# Patient Record
Sex: Male | Born: 1985 | Hispanic: Refuse to answer | Marital: Married
Health system: Midwestern US, Academic
[De-identification: ages and names within clinical notes are randomized; demographics above are authoritative.]

---

## 2017-07-06 ENCOUNTER — Encounter: Admit: 2017-07-06 | Discharge: 2017-07-06

## 2017-07-06 ENCOUNTER — Ambulatory Visit: Admit: 2017-07-06 | Discharge: 2017-07-06 | Payer: Worker's Compensation

## 2017-07-06 DIAGNOSIS — R52 Pain, unspecified: Principal | ICD-10-CM

## 2017-07-11 ENCOUNTER — Ambulatory Visit: Admit: 2017-07-11 | Discharge: 2017-07-11 | Payer: Worker's Compensation

## 2017-07-13 ENCOUNTER — Ambulatory Visit: Admit: 2017-07-13 | Discharge: 2017-07-13 | Payer: Worker's Compensation

## 2017-07-18 ENCOUNTER — Ambulatory Visit: Admit: 2017-07-18 | Discharge: 2017-07-18 | Payer: Worker's Compensation

## 2017-07-19 ENCOUNTER — Ambulatory Visit: Admit: 2017-07-19 | Discharge: 2017-07-19 | Payer: Worker's Compensation

## 2017-07-26 ENCOUNTER — Encounter: Admit: 2017-07-26 | Discharge: 2017-07-26

## 2017-07-26 ENCOUNTER — Ambulatory Visit: Admit: 2017-07-26 | Discharge: 2017-07-26 | Payer: Worker's Compensation

## 2019-02-18 IMAGING — MR Shoulder^ARTHROGRAM
7 of 8 series · 26 of 40 positions shown · IV contrast (with contrast)
Comparison: none

[Series 3: T1 fat-sat · axial · 4.0mm · 0.70mm/px · z∈[-81,+29]mm · 6 of 25 slices shown (1 of 3)]
[im 1/25]
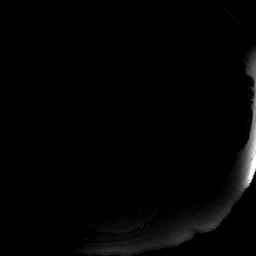
[im 5/25]
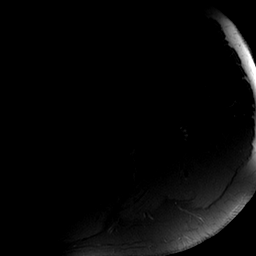
[im 10/25]
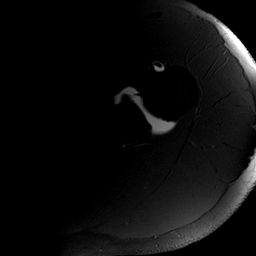
[im 15/25]
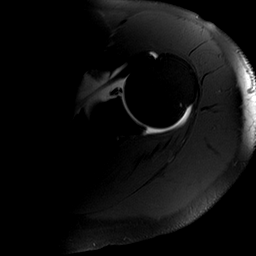
[im 20/25]
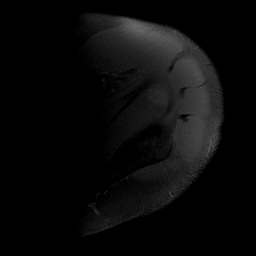
[im 25/25]
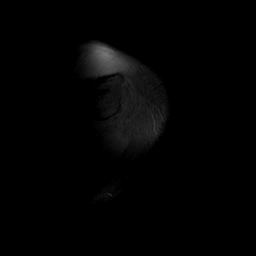

[Series 6: T1 fat-sat · oblique · 4.0mm · 0.27mm/px · 4 of 22 slices shown (2 of 3)]
[im 1/22]
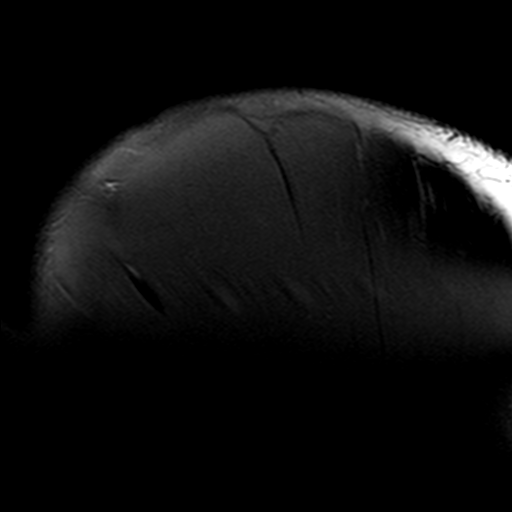
[im 8/22]
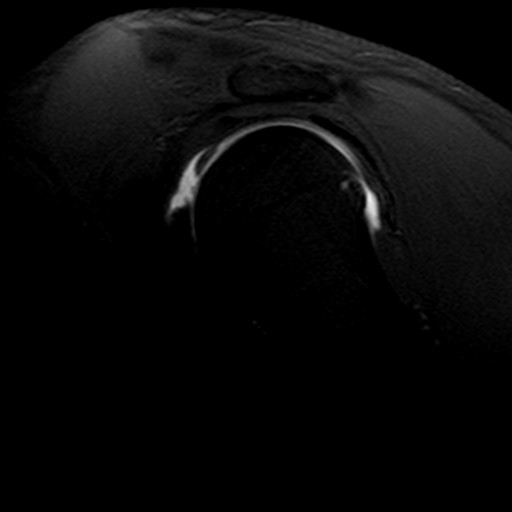
[im 15/22]
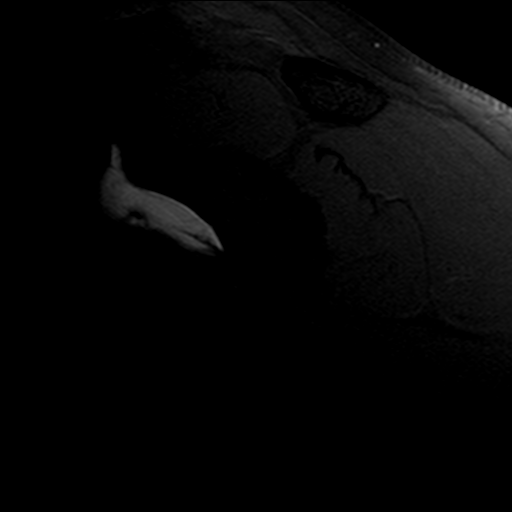
[im 22/22]
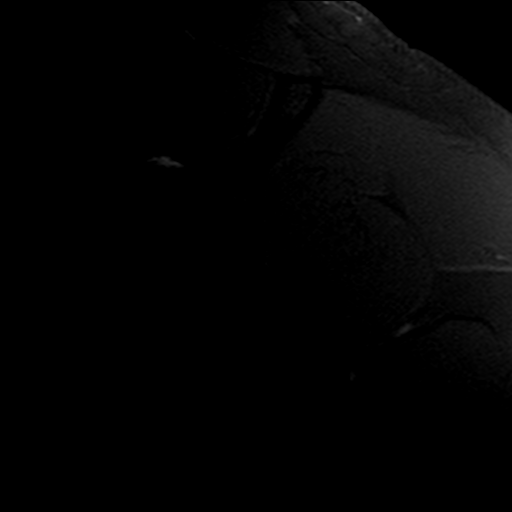

[Series 7: T1 fat-sat · oblique · 4.0mm · 0.27mm/px · 3 of 18 slices shown (3 of 3)]
[im 1/18]
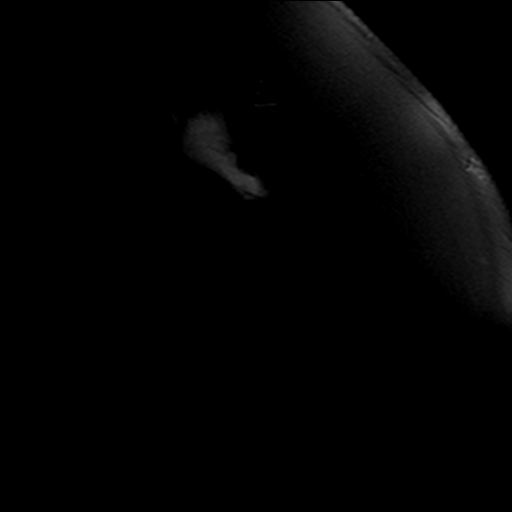
[im 9/18]
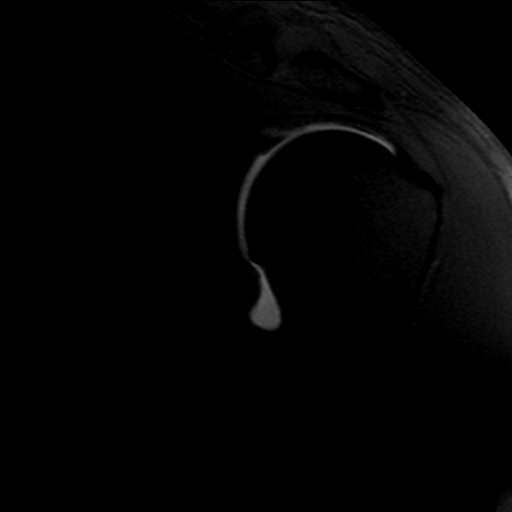
[im 18/18]
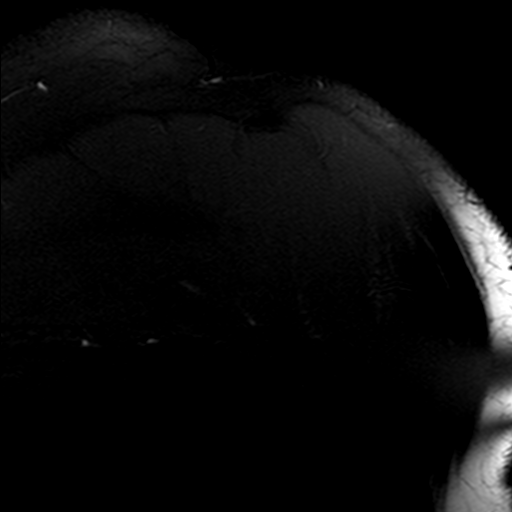

[Series 8: PD · oblique · 4.0mm · 0.55mm/px · 3 of 18 slices shown]
[im 1/18]
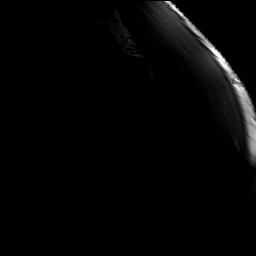
[im 9/18]
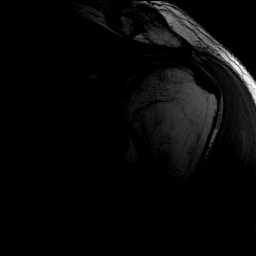
[im 18/18]
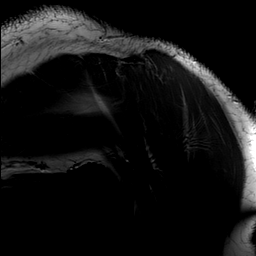

[Series 9: T2 fat-sat · oblique · 4.0mm · 0.31mm/px · 3 of 18 slices shown (1 of 2)]
[im 1/18]
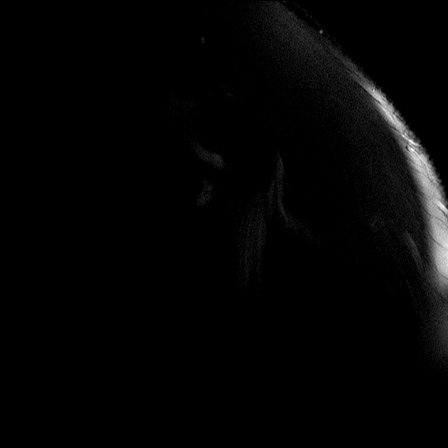
[im 9/18]
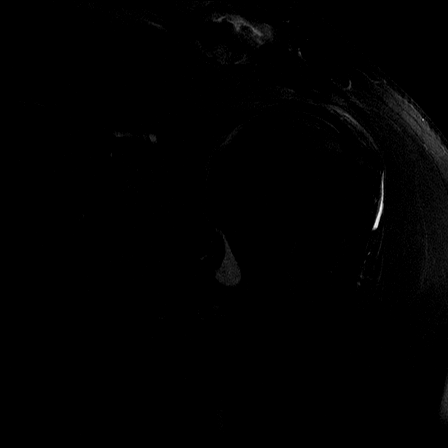
[im 18/18]
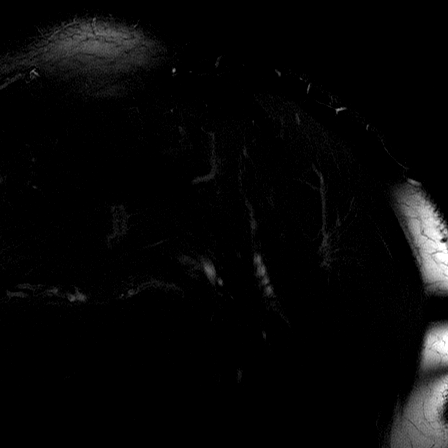

[Series 10: STIR · oblique · 4.0mm · 0.27mm/px · 2 of 18 slices shown]
[im 1/18]
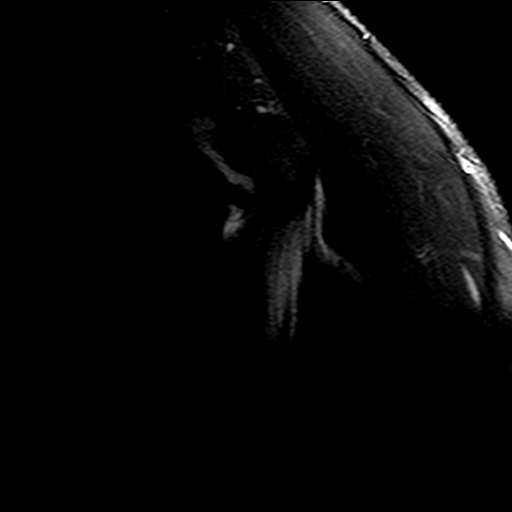
[im 9/18]
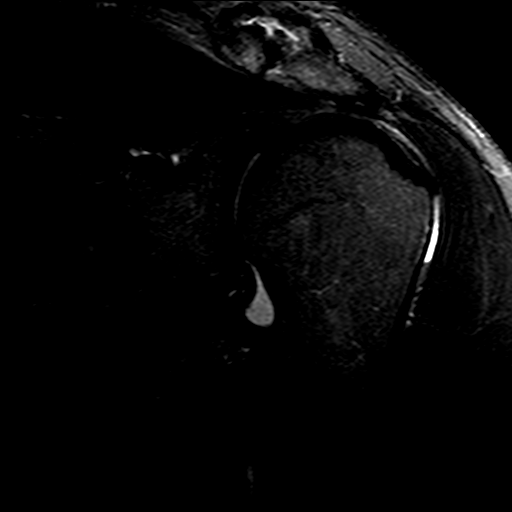

[Series 11: T2 fat-sat · oblique · 4.0mm · 0.31mm/px · 5 of 24 slices shown (2 of 2)]
[im 1/24]
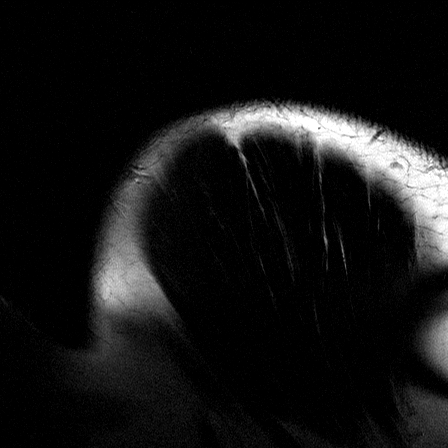
[im 6/24]
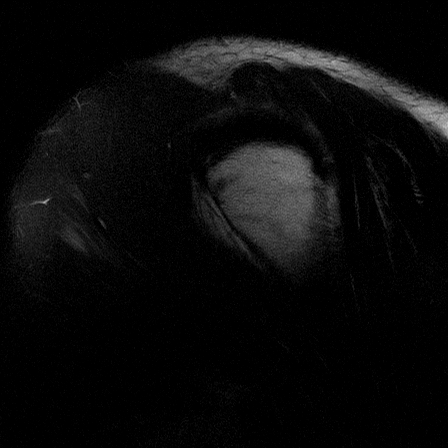
[im 12/24]
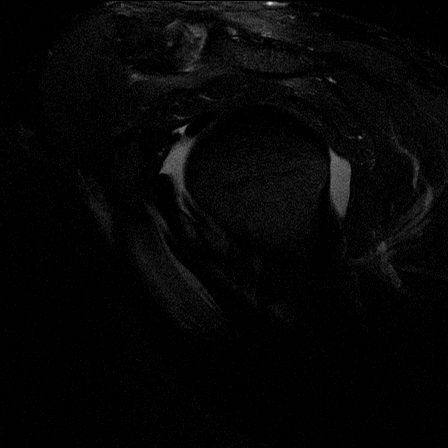
[im 18/24]
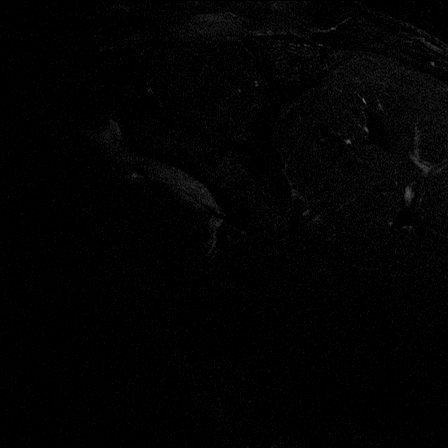
[im 24/24]
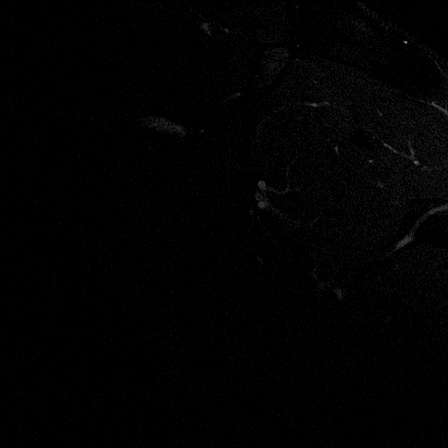

[26 of 40 positions shown; findings below may reference images not displayed]

MRI REPORT

DIAGNOSTIC STUDIES

EXAM

MR left SHOULDER ARTHROGRAM

INDICATION

31-year-old male with left shoulder pain and decreased range of motion.

TECHNIQUE

Multiplanar, multisequence MR images of the left shoulder were obtained following the
intraarticular administration of gadolinium contrast material. Please see same day fluoroscopy
guided arthrogram report for procedural details.

COMPARISONS

Same day fluoroscopic left shoulder arthrogram.

FINDINGS

BICEPS TENDON: The intraarticular biceps tendon is intact and the extraarticular biceps tendon is
well seated within the bicipital groove.

LABRUM: Abnormal contrast in signal of the superior labrum consistent with SLAP tear, most
apparent sagittal image 12 and coronal image 8. Tear appears confined to the posterior/superior
quadrant 11-12 o'clock positions. Additionally, question tiny paralabral cyst formation along the
inferior labrum near the 5 to [DATE] position best seen sagittal image 13 series [DATE] series 11, and
coronal image 9 suspect for additional underlying tearing.

GLENOHUMERAL LIGAMENTS: The superior glenohumeral ligament, middle glenohumeral ligament, and
anterior and posterior bands of the inferior glenohumeral ligament are intact.

Rotator Cuff: There is tiny punctate 1-2 millimeter focus of fluid signal intensity along the
distal supraspinatus insertion anteriorly at the footprint coronal image 7 suggesting a tiny
punctate partial-thickness tear. The remainder of the cuff is well preserved without abnormality.

MUSCLES: Normal bulk and signal.

ACROMIOCLAVICULAR JOINT: Prominent anterior/inferior distal clavicle osteophyte formation and/or
posttraumatic change, which impresses upon the bursal surface of the supraspinatus in the imaging
position. Mild associated marrow edema of the distal clavicle, no fracture line. Flat acromial
undersurface.

Cartilage: No focal defects.

BONE: Acromioclavicular changes previously mention. Remainder of the marrow signal is normal.
Normal alignment.

SOFT TISSUES: There is no soft tissue mass or fluid collection identified. The neurovascular
structures are unremarkable.

IMPRESSION

1. Nondisplaced SLAP tear.

2. Tiny anterior/inferior paralabral cyst formation suggesting subtle underlying inferior labral
tearing.

3. Tiny focal 1-2 millimeter partial-thickness distal supraspinatus insertional tear suspected.

4. Degenerative and/or posttraumatic changes distal clavicle impress upon the bursal surface of
supraspinatus in imaging position. No fracture line. Mild distal clavicle edema. Consider acute on
chronic osteolysis given patient overall increased muscle mass.

## 2019-04-16 ENCOUNTER — Encounter: Admit: 2019-04-16 | Discharge: 2019-04-16 | Payer: Commercial Managed Care - HMO

## 2019-04-16 ENCOUNTER — Ambulatory Visit: Admit: 2019-04-16 | Discharge: 2019-04-16 | Payer: Commercial Managed Care - HMO

## 2019-04-16 DIAGNOSIS — S82291C Other fracture of shaft of right tibia, initial encounter for open fracture type IIIA, IIIB, or IIIC: Secondary | ICD-10-CM
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
## 2019-04-18 ENCOUNTER — Encounter: Admit: 2019-04-18 | Discharge: 2019-04-18 | Payer: Commercial Managed Care - HMO

## 2019-05-10 ENCOUNTER — Encounter: Admit: 2019-05-10 | Discharge: 2019-05-10 | Payer: Commercial Managed Care - HMO

## 2019-05-10 NOTE — Telephone Encounter
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 Order faxed as requested.

## 2019-05-14 ENCOUNTER — Encounter: Admit: 2019-05-14 | Discharge: 2019-05-14 | Payer: Commercial Managed Care - HMO

## 2019-05-29 ENCOUNTER — Encounter: Admit: 2019-05-29 | Discharge: 2019-05-29 | Payer: Commercial Managed Care - HMO

## 2019-05-29 NOTE — Telephone Encounter
LVM for pt advising that his CT scan shows he's healed his fx's and the known OCD lesion. Advised if pt is interested in surgical intervention (nail removal, reconstructive surgery) that we'd request pt return to clinic to discuss this w/ Dr. Cherene Julian as this would be an extensive surgery to undergo. Requested cb w/ questions/concerns or if pt wants to schedule appnt.

## 2022-02-14 ENCOUNTER — Encounter: Admit: 2022-02-14 | Discharge: 2022-02-14 | Payer: Commercial Managed Care - HMO

## 2022-02-14 ENCOUNTER — Ambulatory Visit: Admit: 2022-02-14 | Discharge: 2022-02-14 | Payer: Commercial Managed Care - HMO

## 2022-02-14 DIAGNOSIS — Z0289 Encounter for other administrative examinations: Secondary | ICD-10-CM

## 2023-01-30 ENCOUNTER — Encounter: Admit: 2023-01-30 | Discharge: 2023-01-30 | Payer: Commercial Managed Care - HMO

## 2023-01-30 ENCOUNTER — Ambulatory Visit: Admit: 2023-01-30 | Discharge: 2023-01-30 | Payer: Private Health Insurance - Indemnity

## 2023-01-30 ENCOUNTER — Ambulatory Visit: Admit: 2023-01-30 | Discharge: 2023-01-30

## 2023-01-30 DIAGNOSIS — S8392XA Sprain of unspecified site of left knee, initial encounter: Secondary | ICD-10-CM

## 2023-01-30 NOTE — Progress Notes
Date of Service: 01/30/2023    Employer: [employer] Kyle Murray  Service: Work Comp Medical    Kyle Murray is a 37 y.o. male.  DOB: 1985/09/29  MRN: [id number]     Subjective:             History of Present Illness  Patient was at work 5 days ago and bent over to pick up a cup and twisted his left knee.  He felt a pop and pain in the knee.  He noted swelling the next day with some mild pain but reports that the pain and swelling if increased since.  He denies prior injuries to that knee.  He is now having difficulty walking and bending due to the knee pain.  No other injuries were reported.  Has no other associated symptoms.       Review of Systems  Associated Review of symptoms was negative.      Objective:         Vitals:    01/30/23 1213   BP: (!) 158/102   BP Source: Arm, Left Upper   Pulse: 102   Temp: 36.9 ?C (98.4 ?F)   Resp: 12   SpO2: 98%   TempSrc: Oral   PainSc: Six  Comment: 10/10 when bending of straightening   Weight: 114.1 kg (251 lb 9.6 oz)   Height: 180.3 cm (5' 11)     Body mass index is 35.09 kg/m?Marland Kitchen     Physical Exam  Vitals and nursing note reviewed.   Constitutional:       General: He is not in acute distress.     Appearance: Normal appearance. He is not ill-appearing.   HENT:      Head: Normocephalic.   Pulmonary:      Effort: Pulmonary effort is normal. No respiratory distress.   Musculoskeletal:      Comments: Tender over anterior lateral and posterior aspect of the left knee.  He does have swelling and an effusion.  He is able to extend the knee against resistance.  His collateral ligaments are stable.  He has no swelling or tenderness of his leg or ankle.  His distal neurovascular exam is intact.   Neurological:      General: No focal deficit present.      Mental Status: He is alert and oriented to person, place, and time.      Gait: Gait normal.   Psychiatric:         Mood and Affect: Mood normal.         Behavior: Behavior normal.         Imaging  Test:  Left knee 3 views  Indication: pain after twisting knee  Findings: no acute fracture  X-Rays were performed in this clinic. The findings were reviewed with the patient.  Radiology over read is pending       Assessment and Plan:    Encounter Diagnosis:  (O75.64PP) Sprain of left knee, unspecified ligament, initial encounter       Patient has a sprain on the left knee.  He is having significant pain and limitation with movement.  I have ordered an MRI.  He will need to have restrictions for the time being.  Follow-up in 1 week.       Total Time Today was 45 minutes in the following activities:   - Preparing to see the patient  - Obtaining and/or reviewing separately obtained history (mechanism of injury, work Academic librarian)  - Performing a  medically appropriate examination and/or evaluation (evaluating risk for work disability, functional impact/outcomes)  - Counseling and educating the patient  - Documenting clinical information in the electronic medical record  - Ordering medications, tests and/or procedures  - Discussion of tests and procedures indicated at this appointment

## 2023-02-07 ENCOUNTER — Encounter: Admit: 2023-02-07 | Discharge: 2023-02-07 | Payer: Commercial Managed Care - HMO

## 2023-02-07 ENCOUNTER — Ambulatory Visit: Admit: 2023-02-07 | Discharge: 2023-02-07 | Payer: Private Health Insurance - Indemnity

## 2023-02-07 DIAGNOSIS — S8392XD Sprain of unspecified site of left knee, subsequent encounter: Secondary | ICD-10-CM

## 2023-02-07 NOTE — Progress Notes
Date of Service: 02/07/2023    Employer: [employer]  Service: Work Comp Return    Kyle Murray is a 37 y.o. male.  DOB: 1985/10/25  MRN: [id number]     Subjective:             History of Present Illness  The patient returns today for follow-up.  Complains of continued pain and instability in the left knee.  He has had no improvement.  He has not yet heard about his MRI.  He has no new complaints.       Review of Systems  Associated Review of symptoms was negative.      Objective:         Vitals:    02/07/23 1338 02/07/23 1348   BP: (!) 161/103 (!) 161/106   BP Source: Arm, Left Upper  Comment: large cuff used Arm, Left Upper   Pulse: 88    Temp: 36.4 ?C (97.5 ?F)    Resp: 14    SpO2: 99%    TempSrc: Oral    PainSc: Four    Weight: 116.4 kg (256 lb 9.6 oz)    Height: 180.3 cm (5' 11)      Body mass index is 35.79 kg/m?Marland Kitchen     Physical Exam  Vitals and nursing note reviewed.   Constitutional:       General: He is not in acute distress.     Appearance: Normal appearance. He is not ill-appearing.   HENT:      Head: Normocephalic.   Pulmonary:      Effort: Pulmonary effort is normal. No respiratory distress.   Musculoskeletal:         General: Swelling, tenderness and signs of injury present.      Right lower leg: No edema.      Left lower leg: No edema.   Skin:     Findings: No bruising or rash.   Neurological:      General: No focal deficit present.      Mental Status: He is alert and oriented to person, place, and time.      Sensory: No sensory deficit.      Gait: Gait normal.   Psychiatric:         Mood and Affect: Mood normal.         Behavior: Behavior normal.              Assessment and Plan:    Encounter Diagnosis:  (S83.92XD) Sprain of left knee, unspecified ligament, subsequent encounter       The patient is still having significant knee pain.  I am recommending an MRI.  He will need follow-up with [place] in 2 weeks unless he has his MRI earlier.  He will need to keep his current restrictions.  He is hypertensive here and I have advised him to follow-up with his primary care physician for management of his blood pressure.       Total Time Today was 30 minutes in the following activities:   - Preparing to see the patient  - Performing a medically appropriate examination and/or evaluation (evaluating risk for work disability, functional impact/outcomes)  - Counseling and educating the patient  - Documenting clinical information in the electronic medical record  - Discussion of tests and procedures indicated at this appointment
